# Patient Record
Sex: Female | Born: 2000 | Race: Black or African American | Hispanic: No | Marital: Single | Smoking: Never smoker
Health system: Southern US, Community
[De-identification: ages and names within clinical notes are randomized; demographics above are authoritative.]

---
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
## 2020-04-23 DIAGNOSIS — Z419 Encounter for procedure for purposes other than remedying health state, unspecified: Secondary | ICD-10-CM | POA: Diagnosis not present

## 2020-05-24 DIAGNOSIS — Z419 Encounter for procedure for purposes other than remedying health state, unspecified: Secondary | ICD-10-CM | POA: Diagnosis not present

## 2020-06-24 DIAGNOSIS — Z419 Encounter for procedure for purposes other than remedying health state, unspecified: Secondary | ICD-10-CM | POA: Diagnosis not present

## 2020-07-24 DIAGNOSIS — Z419 Encounter for procedure for purposes other than remedying health state, unspecified: Secondary | ICD-10-CM | POA: Diagnosis not present

## 2020-08-24 DIAGNOSIS — Z419 Encounter for procedure for purposes other than remedying health state, unspecified: Secondary | ICD-10-CM | POA: Diagnosis not present

## 2020-09-23 DIAGNOSIS — Z419 Encounter for procedure for purposes other than remedying health state, unspecified: Secondary | ICD-10-CM | POA: Diagnosis not present

## 2020-10-24 DIAGNOSIS — Z419 Encounter for procedure for purposes other than remedying health state, unspecified: Secondary | ICD-10-CM | POA: Diagnosis not present

## 2020-11-01 DIAGNOSIS — Z20822 Contact with and (suspected) exposure to covid-19: Secondary | ICD-10-CM | POA: Diagnosis not present

## 2020-11-24 DIAGNOSIS — Z419 Encounter for procedure for purposes other than remedying health state, unspecified: Secondary | ICD-10-CM | POA: Diagnosis not present

## 2020-12-18 ENCOUNTER — Encounter (HOSPITAL_COMMUNITY): Payer: Self-pay | Admitting: Emergency Medicine

## 2020-12-18 ENCOUNTER — Other Ambulatory Visit: Payer: Self-pay

## 2020-12-18 ENCOUNTER — Ambulatory Visit (HOSPITAL_COMMUNITY)
Admission: EM | Admit: 2020-12-18 | Discharge: 2020-12-18 | Disposition: A | Discharge status: Home / Self Care | Payer: Medicaid Other | Attending: Emergency Medicine | Admitting: Emergency Medicine

## 2020-12-18 DIAGNOSIS — K029 Dental caries, unspecified: Secondary | ICD-10-CM | POA: Diagnosis not present

## 2020-12-18 MED ORDER — IBUPROFEN 800 MG PO TABS: 800.0000 mg | ORAL_TABLET | Freq: Three times a day (TID) | ORAL | 0 refills | Status: AC | PRN

## 2020-12-18 MED ORDER — AMOXICILLIN 875 MG PO TABS: 875.0000 mg | ORAL_TABLET | Freq: Two times a day (BID) | ORAL | 0 refills | Status: AC

## 2020-12-18 NOTE — ED Triage Notes (Signed)
Pt presents with tooth pain xs 2 days. States is getting progressively worse. States has taken advil and mouth wash and gel with no relief.

## 2020-12-18 NOTE — ED Provider Notes (Signed)
[facility]    CSN: [id number] Arrival date & time: 12/18/20  1024      History   Chief Complaint Chief Complaint  Patient presents with  . Dental Pain    HPI Taylor Curtis is a 20 y.o. female.   Patient presents with 2-day history of dental pain.  She states the pain is on the left side, upper and lower, no specific tooth.  Treatment attempted at home with hydrogen peroxide mouthwash and Advil.  She denies fever, chills, difficulty swallowing, or other symptoms.  No pertinent medical history.  The history is provided by the patient.    History reviewed. No pertinent past medical history.  There are no problems to display for this patient.   History reviewed. No pertinent surgical history.  OB History   No obstetric history on file.      Home Medications    Prior to Admission medications   Medication Sig Start Date End Date Taking? Authorizing Provider  amoxicillin (AMOXIL) 875 MG tablet Take 1 tablet (875 mg total) by mouth 2 (two) times daily for 7 days. 12/18/20 12/25/20 Yes Mickie Bail, NP  ibuprofen (ADVIL) 800 MG tablet Take 1 tablet (800 mg total) by mouth every 8 (eight) hours as needed. 12/18/20  Yes Mickie Bail, NP    Family History History reviewed. No pertinent family history.  Social History Social History   Tobacco Use  . Smoking status: Never Smoker  . Smokeless tobacco: Never Used  Substance Use Topics  . Alcohol use: Not Currently     Allergies   Patient has no known allergies.   Review of Systems Review of Systems  Constitutional: Negative for chills and fever.  HENT: Positive for dental problem. Negative for ear pain and sore throat.   Eyes: Negative for pain and visual disturbance.  Respiratory: Negative for cough and shortness of breath.   Cardiovascular: Negative for chest pain and palpitations.  Gastrointestinal: Negative for abdominal pain and vomiting.  Genitourinary: Negative for dysuria and hematuria.   Musculoskeletal: Negative for arthralgias and back pain.  Skin: Negative for color change and rash.  Neurological: Negative for seizures and syncope.  All other systems reviewed and are negative.    Physical Exam Triage Vital Signs ED Triage Vitals  Enc Vitals Group     BP      Pulse      Resp      Temp      Temp src      SpO2      Weight      Height      Head Circumference      Peak Flow      Pain Score      Pain Loc      Pain Edu?      Excl. in GC?    No data found.  Updated Vital Signs BP 121/80 (BP Location: Right Arm)   Pulse 95   Temp 98.2 F (36.8 C) (Oral)   Resp 17   LMP 12/10/2020   SpO2 98%   Visual Acuity Right Eye Distance:   Left Eye Distance:   Bilateral Distance:    Right Eye Near:   Left Eye Near:    Bilateral Near:     Physical Exam Vitals and nursing note reviewed.  Constitutional:      General: She is not in acute distress.    Appearance: She is well-developed and well-nourished. She is not ill-appearing.  HENT:  Head: Normocephalic and atraumatic.     Mouth/Throat:     Mouth: Mucous membranes are moist.     Dentition: Dental tenderness and dental caries present.  Eyes:     Conjunctiva/sclera: Conjunctivae normal.  Cardiovascular:     Rate and Rhythm: Normal rate and regular rhythm.     Heart sounds: Normal heart sounds.  Pulmonary:     Effort: Pulmonary effort is normal. No respiratory distress.     Breath sounds: Normal breath sounds.  Abdominal:     Palpations: Abdomen is soft.     Tenderness: There is no abdominal tenderness.  Musculoskeletal:        General: No edema.     Cervical back: Neck supple.  Skin:    General: Skin is warm and dry.  Neurological:     General: No focal deficit present.     Mental Status: She is alert and oriented to person, place, and time.     Gait: Gait normal.  Psychiatric:        Mood and Affect: Mood and affect and mood normal.        Behavior: Behavior normal.      UC  Treatments / Results  Labs (all labs ordered are listed, but only abnormal results are displayed) Labs Reviewed - No data to display  EKG   Radiology No results found.  Procedures Procedures (including critical care time)  Medications Ordered in UC Medications - No data to display  Initial Impression / Assessment and Plan / UC Course  I have reviewed the triage vital signs and the nursing notes.  Pertinent labs & imaging results that were available during my care of the patient were reviewed by me and considered in my medical decision making (see chart for details).   Dental pain due to dental caries.  Treating with amoxicillin and ibuprofen.  Dental resource guide provided and patient instructed to schedule appointment with a dentist as soon as possible.  She agrees to plan of care.   Final Clinical Impressions(s) / UC Diagnoses   Final diagnoses:  Pain due to dental caries     Discharge Instructions     Take the antibiotic and ibuprofen as prescribed.    A dental resource guide is attached.  Please call to make an appointment with a dentist as soon as possible.          ED Prescriptions    Medication Sig Dispense Auth. Provider   amoxicillin (AMOXIL) 875 MG tablet Take 1 tablet (875 mg total) by mouth 2 (two) times daily for 7 days. 14 tablet Wendee Beavers H, NP   ibuprofen (ADVIL) 800 MG tablet Take 1 tablet (800 mg total) by mouth every 8 (eight) hours as needed. 21 tablet Mickie Bail, NP     I have reviewed the PDMP during this encounter.   Mickie Bail, NP 12/18/20 1158

## 2020-12-18 NOTE — Discharge Instructions (Signed)
Take the antibiotic and ibuprofen as prescribed.    A dental resource guide is attached.  Please call to make an appointment with a dentist as soon as possible.

## 2020-12-22 DIAGNOSIS — Z419 Encounter for procedure for purposes other than remedying health state, unspecified: Secondary | ICD-10-CM | POA: Diagnosis not present

## 2021-01-22 DIAGNOSIS — Z419 Encounter for procedure for purposes other than remedying health state, unspecified: Secondary | ICD-10-CM | POA: Diagnosis not present

## 2021-02-21 DIAGNOSIS — Z419 Encounter for procedure for purposes other than remedying health state, unspecified: Secondary | ICD-10-CM | POA: Diagnosis not present

## 2021-03-24 DIAGNOSIS — Z419 Encounter for procedure for purposes other than remedying health state, unspecified: Secondary | ICD-10-CM | POA: Diagnosis not present

## 2021-04-17 ENCOUNTER — Emergency Department (HOSPITAL_COMMUNITY)
Admission: EM | Admit: 2021-04-17 | Discharge: 2021-04-17 | Disposition: A | Discharge status: Home / Self Care | Payer: Medicaid Other | Attending: Emergency Medicine | Admitting: Emergency Medicine

## 2021-04-17 ENCOUNTER — Emergency Department (HOSPITAL_COMMUNITY): Payer: Medicaid Other

## 2021-04-17 DIAGNOSIS — M7918 Myalgia, other site: Secondary | ICD-10-CM

## 2021-04-17 DIAGNOSIS — S4991XA Unspecified injury of right shoulder and upper arm, initial encounter: Secondary | ICD-10-CM | POA: Diagnosis present

## 2021-04-17 DIAGNOSIS — S40021A Contusion of right upper arm, initial encounter: Secondary | ICD-10-CM | POA: Insufficient documentation

## 2021-04-17 DIAGNOSIS — M79601 Pain in right arm: Secondary | ICD-10-CM

## 2021-04-17 DIAGNOSIS — M542 Cervicalgia: Secondary | ICD-10-CM | POA: Diagnosis not present

## 2021-04-17 DIAGNOSIS — M79603 Pain in arm, unspecified: Secondary | ICD-10-CM | POA: Diagnosis not present

## 2021-04-17 DIAGNOSIS — T148XXA Other injury of unspecified body region, initial encounter: Secondary | ICD-10-CM

## 2021-04-17 DIAGNOSIS — M791 Myalgia, unspecified site: Secondary | ICD-10-CM | POA: Diagnosis not present

## 2021-04-17 DIAGNOSIS — Y92410 Unspecified street and highway as the place of occurrence of the external cause: Secondary | ICD-10-CM | POA: Insufficient documentation

## 2021-04-17 DIAGNOSIS — S199XXA Unspecified injury of neck, initial encounter: Secondary | ICD-10-CM | POA: Diagnosis not present

## 2021-04-17 DIAGNOSIS — Z041 Encounter for examination and observation following transport accident: Secondary | ICD-10-CM | POA: Diagnosis not present

## 2021-04-17 DIAGNOSIS — R519 Headache, unspecified: Secondary | ICD-10-CM | POA: Insufficient documentation

## 2021-04-17 DIAGNOSIS — S0990XA Unspecified injury of head, initial encounter: Secondary | ICD-10-CM | POA: Diagnosis not present

## 2021-04-17 DIAGNOSIS — M7989 Other specified soft tissue disorders: Secondary | ICD-10-CM | POA: Diagnosis not present

## 2021-04-17 LAB — COMPREHENSIVE METABOLIC PANEL
ALT: 10 U/L (ref 0–44)
AST: 18 U/L (ref 15–41)
Albumin: 4.2 g/dL (ref 3.5–5.0)
Alkaline Phosphatase: 65 U/L (ref 38–126)
Anion gap: 7 (ref 5–15)
BUN: 10 mg/dL (ref 6–20)
CO2: 25 mmol/L (ref 22–32)
Calcium: 10 mg/dL (ref 8.9–10.3)
Chloride: 108 mmol/L (ref 98–111)
Creatinine, Ser: 1.12 mg/dL — ABNORMAL HIGH (ref 0.44–1.00)
GFR, Estimated: >60 mL/min (ref >60)
Glucose, Bld: 92 mg/dL (ref 70–99)
Potassium: 3.8 mmol/L (ref 3.5–5.1)
Sodium: 140 mmol/L (ref 135–145)
Total Bilirubin: 1 mg/dL (ref 0.3–1.2)
Total Protein: 7.7 g/dL (ref 6.5–8.1)

## 2021-04-17 LAB — CBC WITH DIFFERENTIAL/PLATELET
Abs Immature Granulocytes: 0.04 10*3/uL (ref 0.00–0.07)
Basophils Absolute: 0 10*3/uL (ref 0.0–0.1)
Basophils Relative: 0 %
Eosinophils Absolute: 0.4 10*3/uL (ref 0.0–0.5)
Eosinophils Relative: 3 %
HCT: 36.2 % (ref 36.0–46.0)
Hemoglobin: 11.3 g/dL — ABNORMAL LOW (ref 12.0–15.0)
Immature Granulocytes: 0 %
Lymphocytes Relative: 33 %
Lymphs Abs: 4.5 10*3/uL — ABNORMAL HIGH (ref 0.7–4.0)
MCH: 27.8 pg (ref 26.0–34.0)
MCHC: 31.2 g/dL (ref 30.0–36.0)
MCV: 89.2 fL (ref 80.0–100.0)
Monocytes Absolute: 0.7 10*3/uL (ref 0.1–1.0)
Monocytes Relative: 5 %
Neutro Abs: 7.9 10*3/uL — ABNORMAL HIGH (ref 1.7–7.7)
Neutrophils Relative %: 59 %
Platelets: 335 10*3/uL (ref 150–400)
RBC: 4.06 MIL/uL (ref 3.87–5.11)
RDW: 14.5 % (ref 11.5–15.5)
WBC: 13.6 10*3/uL — ABNORMAL HIGH (ref 4.0–10.5)
nRBC: 0 % (ref 0.0–0.2)

## 2021-04-17 LAB — I-STAT BETA HCG BLOOD, ED (MC, WL, AP ONLY): I-stat hCG, quantitative: <5 m[IU]/mL (ref <5)

## 2021-04-17 MED ORDER — ONDANSETRON HCL 4 MG/2ML IJ SOLN
4.0000 mg | Freq: Once | INTRAMUSCULAR | Status: AC
  Administered 2021-04-17: 4 mg via INTRAVENOUS
  Filled 2021-04-17: qty 2

## 2021-04-17 MED ORDER — METHOCARBAMOL 500 MG PO TABS: 500.0000 mg | ORAL_TABLET | Freq: Two times a day (BID) | ORAL | 0 refills | Status: AC

## 2021-04-17 MED ORDER — FENTANYL CITRATE (PF) 100 MCG/2ML IJ SOLN
50.0000 ug | Freq: Once | INTRAMUSCULAR | Status: AC
  Administered 2021-04-17: 50 ug via INTRAVENOUS
  Filled 2021-04-17: qty 2
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
## 2021-04-23 DIAGNOSIS — Z419 Encounter for procedure for purposes other than remedying health state, unspecified: Secondary | ICD-10-CM | POA: Diagnosis not present

## 2021-05-24 DIAGNOSIS — Z419 Encounter for procedure for purposes other than remedying health state, unspecified: Secondary | ICD-10-CM | POA: Diagnosis not present

## 2021-06-24 DIAGNOSIS — Z419 Encounter for procedure for purposes other than remedying health state, unspecified: Secondary | ICD-10-CM | POA: Diagnosis not present

## 2021-07-24 DIAGNOSIS — Z419 Encounter for procedure for purposes other than remedying health state, unspecified: Secondary | ICD-10-CM | POA: Diagnosis not present

## 2021-08-24 DIAGNOSIS — Z419 Encounter for procedure for purposes other than remedying health state, unspecified: Secondary | ICD-10-CM | POA: Diagnosis not present

## 2021-09-23 DIAGNOSIS — Z419 Encounter for procedure for purposes other than remedying health state, unspecified: Secondary | ICD-10-CM | POA: Diagnosis not present

## 2021-10-24 DIAGNOSIS — Z419 Encounter for procedure for purposes other than remedying health state, unspecified: Secondary | ICD-10-CM | POA: Diagnosis not present

## 2021-11-24 DIAGNOSIS — Z419 Encounter for procedure for purposes other than remedying health state, unspecified: Secondary | ICD-10-CM | POA: Diagnosis not present

## 2021-12-22 DIAGNOSIS — Z419 Encounter for procedure for purposes other than remedying health state, unspecified: Secondary | ICD-10-CM | POA: Diagnosis not present

## 2022-01-22 DIAGNOSIS — Z419 Encounter for procedure for purposes other than remedying health state, unspecified: Secondary | ICD-10-CM | POA: Diagnosis not present

## 2022-02-21 DIAGNOSIS — Z419 Encounter for procedure for purposes other than remedying health state, unspecified: Secondary | ICD-10-CM | POA: Diagnosis not present

## 2022-03-24 DIAGNOSIS — Z419 Encounter for procedure for purposes other than remedying health state, unspecified: Secondary | ICD-10-CM | POA: Diagnosis not present

## 2022-04-23 DIAGNOSIS — Z419 Encounter for procedure for purposes other than remedying health state, unspecified: Secondary | ICD-10-CM | POA: Diagnosis not present

## 2022-05-24 DIAGNOSIS — Z419 Encounter for procedure for purposes other than remedying health state, unspecified: Secondary | ICD-10-CM | POA: Diagnosis not present

## 2022-06-24 DIAGNOSIS — Z419 Encounter for procedure for purposes other than remedying health state, unspecified: Secondary | ICD-10-CM | POA: Diagnosis not present

## 2022-07-24 DIAGNOSIS — Z419 Encounter for procedure for purposes other than remedying health state, unspecified: Secondary | ICD-10-CM | POA: Diagnosis not present

## 2022-09-23 DIAGNOSIS — Z419 Encounter for procedure for purposes other than remedying health state, unspecified: Secondary | ICD-10-CM | POA: Diagnosis not present

## 2022-10-24 DIAGNOSIS — Z419 Encounter for procedure for purposes other than remedying health state, unspecified: Secondary | ICD-10-CM | POA: Diagnosis not present
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
## 2022-11-24 DIAGNOSIS — Z419 Encounter for procedure for purposes other than remedying health state, unspecified: Secondary | ICD-10-CM | POA: Diagnosis not present

## 2022-12-23 DIAGNOSIS — Z419 Encounter for procedure for purposes other than remedying health state, unspecified: Secondary | ICD-10-CM | POA: Diagnosis not present

## 2023-01-23 DIAGNOSIS — Z419 Encounter for procedure for purposes other than remedying health state, unspecified: Secondary | ICD-10-CM | POA: Diagnosis not present

## 2023-02-22 DIAGNOSIS — Z419 Encounter for procedure for purposes other than remedying health state, unspecified: Secondary | ICD-10-CM | POA: Diagnosis not present

## 2023-03-02 ENCOUNTER — Telehealth: Payer: Self-pay

## 2023-03-02 NOTE — Telephone Encounter (Signed)
LVM for patient to call back. AS, CMA 

## 2023-03-25 DIAGNOSIS — Z419 Encounter for procedure for purposes other than remedying health state, unspecified: Secondary | ICD-10-CM | POA: Diagnosis not present

## 2023-04-24 DIAGNOSIS — Z419 Encounter for procedure for purposes other than remedying health state, unspecified: Secondary | ICD-10-CM | POA: Diagnosis not present

## 2023-05-25 DIAGNOSIS — Z419 Encounter for procedure for purposes other than remedying health state, unspecified: Secondary | ICD-10-CM | POA: Diagnosis not present

## 2023-06-25 DIAGNOSIS — Z419 Encounter for procedure for purposes other than remedying health state, unspecified: Secondary | ICD-10-CM | POA: Diagnosis not present

## 2023-07-25 DIAGNOSIS — Z419 Encounter for procedure for purposes other than remedying health state, unspecified: Secondary | ICD-10-CM | POA: Diagnosis not present

## 2023-08-25 DIAGNOSIS — Z419 Encounter for procedure for purposes other than remedying health state, unspecified: Secondary | ICD-10-CM | POA: Diagnosis not present

## 2023-09-24 DIAGNOSIS — Z419 Encounter for procedure for purposes other than remedying health state, unspecified: Secondary | ICD-10-CM | POA: Diagnosis not present

## 2023-10-25 DIAGNOSIS — Z419 Encounter for procedure for purposes other than remedying health state, unspecified: Secondary | ICD-10-CM | POA: Diagnosis not present

## 2023-11-24 ENCOUNTER — Emergency Department (HOSPITAL_COMMUNITY): Payer: No Typology Code available for payment source

## 2023-11-24 ENCOUNTER — Emergency Department (HOSPITAL_COMMUNITY)
Admission: EM | Admit: 2023-11-24 | Discharge: 2023-11-24 | Disposition: A | Discharge status: Home / Self Care | Payer: No Typology Code available for payment source | Attending: Emergency Medicine | Admitting: Emergency Medicine

## 2023-11-24 ENCOUNTER — Encounter (HOSPITAL_COMMUNITY): Payer: Self-pay

## 2023-11-24 ENCOUNTER — Other Ambulatory Visit: Payer: Self-pay

## 2023-11-24 DIAGNOSIS — M542 Cervicalgia: Secondary | ICD-10-CM | POA: Diagnosis not present

## 2023-11-24 DIAGNOSIS — S199XXA Unspecified injury of neck, initial encounter: Secondary | ICD-10-CM | POA: Diagnosis not present

## 2023-11-24 DIAGNOSIS — S5011XA Contusion of right forearm, initial encounter: Secondary | ICD-10-CM | POA: Insufficient documentation

## 2023-11-24 DIAGNOSIS — S59911A Unspecified injury of right forearm, initial encounter: Secondary | ICD-10-CM | POA: Diagnosis not present

## 2023-11-24 DIAGNOSIS — Z041 Encounter for examination and observation following transport accident: Secondary | ICD-10-CM | POA: Diagnosis not present

## 2023-11-24 DIAGNOSIS — S161XXA Strain of muscle, fascia and tendon at neck level, initial encounter: Secondary | ICD-10-CM | POA: Insufficient documentation

## 2023-11-24 DIAGNOSIS — M79639 Pain in unspecified forearm: Secondary | ICD-10-CM | POA: Diagnosis not present

## 2023-11-24 DIAGNOSIS — Y92410 Unspecified street and highway as the place of occurrence of the external cause: Secondary | ICD-10-CM | POA: Diagnosis not present

## 2023-11-24 DIAGNOSIS — R6889 Other general symptoms and signs: Secondary | ICD-10-CM | POA: Diagnosis not present

## 2023-11-24 DIAGNOSIS — M79631 Pain in right forearm: Secondary | ICD-10-CM | POA: Diagnosis not present

## 2023-11-24 DIAGNOSIS — Z743 Need for continuous supervision: Secondary | ICD-10-CM | POA: Diagnosis not present

## 2023-11-24 MED ORDER — ACETAMINOPHEN 325 MG PO TABS
650.0000 mg | ORAL_TABLET | Freq: Once | ORAL | Status: AC
  Administered 2023-11-24: 650 mg via ORAL
  Filled 2023-11-24: qty 2
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
## 2023-11-25 DIAGNOSIS — Z419 Encounter for procedure for purposes other than remedying health state, unspecified: Secondary | ICD-10-CM | POA: Diagnosis not present

## 2023-12-23 DIAGNOSIS — Z419 Encounter for procedure for purposes other than remedying health state, unspecified: Secondary | ICD-10-CM | POA: Diagnosis not present

## 2024-02-03 DIAGNOSIS — Z419 Encounter for procedure for purposes other than remedying health state, unspecified: Secondary | ICD-10-CM | POA: Diagnosis not present

## 2024-03-04 DIAGNOSIS — Z419 Encounter for procedure for purposes other than remedying health state, unspecified: Secondary | ICD-10-CM | POA: Diagnosis not present

## 2024-04-04 DIAGNOSIS — Z419 Encounter for procedure for purposes other than remedying health state, unspecified: Secondary | ICD-10-CM | POA: Diagnosis not present

## 2024-05-04 DIAGNOSIS — Z419 Encounter for procedure for purposes other than remedying health state, unspecified: Secondary | ICD-10-CM | POA: Diagnosis not present

## 2024-06-04 DIAGNOSIS — Z419 Encounter for procedure for purposes other than remedying health state, unspecified: Secondary | ICD-10-CM | POA: Diagnosis not present

## 2024-07-05 ENCOUNTER — Encounter (HOSPITAL_COMMUNITY): Payer: Self-pay

## 2024-07-05 ENCOUNTER — Ambulatory Visit (HOSPITAL_COMMUNITY)
Admission: EM | Admit: 2024-07-05 | Discharge: 2024-07-05 | Disposition: A | Discharge status: Home / Self Care | Attending: Emergency Medicine | Admitting: Emergency Medicine

## 2024-07-05 DIAGNOSIS — Z419 Encounter for procedure for purposes other than remedying health state, unspecified: Secondary | ICD-10-CM | POA: Diagnosis not present

## 2024-07-05 DIAGNOSIS — J029 Acute pharyngitis, unspecified: Secondary | ICD-10-CM

## 2024-07-05 LAB — POCT RAPID STREP A (OFFICE): Rapid Strep A Screen: NEGATIVE

## 2024-07-05 MED ORDER — AZITHROMYCIN 250 MG PO TABS: 250.0000 mg | ORAL_TABLET | Freq: Every day | ORAL | 0 refills | Status: AC

## 2024-07-05 NOTE — ED Triage Notes (Signed)
 Pt states sore throat for the past 4 days. States she has been taking cough drops at home and drinking hot tea.

## 2024-07-05 NOTE — ED Provider Notes (Signed)
 [facility]    CSN: [id number] Arrival date & time: 07/05/24  1344      History   Chief Complaint Chief Complaint  Patient presents with   Sore Throat    HPI Taylor Curtis is a 23 y.o. female.   Patient presents today with sore throat for the past couple of days.  Denies any fevers no shortness of breath or chest pain.  She has been taking cough drops and drinking tea at home with no relief.  Has had strep in the past with similar symptoms    History reviewed. No pertinent past medical history.  There are no active problems to display for this patient.   History reviewed. No pertinent surgical history.  OB History   No obstetric history on file.      Home Medications    Prior to Admission medications   Medication Sig Start Date End Date Taking? Authorizing Provider  azithromycin  (ZITHROMAX ) 250 MG tablet Take 1 tablet (250 mg total) by mouth daily. Take first 2 tablets together, then 1 every day until finished. 07/05/24  Yes Merilee Andrea CROME, NP  ibuprofen  (ADVIL ) 800 MG tablet Take 1 tablet (800 mg total) by mouth every 8 (eight) hours as needed. 12/18/20   Corlis Burnard DEL, NP  methocarbamol  (ROBAXIN ) 500 MG tablet Take 1 tablet (500 mg total) by mouth 2 (two) times daily. 04/17/21   Layden, Lindsey A, PA-C  Multiple Vitamins-Minerals (MULTIVITAMIN ADULT) CHEW Chew 2 tablets by mouth daily.    [provider]    Family History History reviewed. No pertinent family history.  Social History Social History   Tobacco Use   Smoking status: Never   Smokeless tobacco: Never  Substance Use Topics   Alcohol use: Not Currently     Allergies   Patient has no known allergies.   Review of Systems Review of Systems  Constitutional:  Negative for chills.       Unknown fever has not been checking  HENT:  Positive for sore throat. Negative for congestion.   Respiratory: Negative.    Cardiovascular: Negative.   Gastrointestinal: Negative.    Genitourinary: Negative.      Physical Exam Triage Vital Signs ED Triage Vitals [07/05/24 1509]  Encounter Vitals Group     BP 109/74     Girls Systolic BP Percentile      Girls Diastolic BP Percentile      Boys Systolic BP Percentile      Boys Diastolic BP Percentile      Pulse Rate 96     Resp 16     Temp 98.2 F (36.8 C)     Temp Source Oral     SpO2 97 %     Weight      Height      Head Circumference      Peak Flow      Pain Score 6     Pain Loc      Pain Education      Exclude from Growth Chart    No data found.  Updated Vital Signs BP 109/74 (BP Location: Right Arm)   Pulse 96   Temp 98.2 F (36.8 C) (Oral)   Resp 16   LMP 05/31/2024 (Approximate)   SpO2 97%   Visual Acuity Right Eye Distance:   Left Eye Distance:   Bilateral Distance:    Right Eye Near:   Left Eye Near:    Bilateral Near:     Physical  Exam Constitutional:      Appearance: She is well-developed.  HENT:     Right Ear: Tympanic membrane normal.     Left Ear: Tympanic membrane normal.     Nose: No congestion or rhinorrhea.     Mouth/Throat:     Mouth: Oral lesions present.     Pharynx: Pharyngeal swelling present.     Tonsils: Tonsillar exudate present. No tonsillar abscesses. 2+ on the right. 2+ on the left.  Cardiovascular:     Rate and Rhythm: Normal rate.     Heart sounds: Normal heart sounds.  Pulmonary:     Effort: Pulmonary effort is normal.  Abdominal:     Palpations: Abdomen is soft.  Skin:    Findings: No rash.  Neurological:     Mental Status: She is alert.      UC Treatments / Results  Labs (all labs ordered are listed, but only abnormal results are displayed) Labs Reviewed  POCT RAPID STREP A (OFFICE) - Normal    EKG   Radiology No results found.  Procedures Procedures (including critical care time)  Medications Ordered in UC Medications - No data to display  Initial Impression / Assessment and Plan / UC Course  I have reviewed the triage  vital signs and the nursing notes.  Pertinent labs & imaging results that were available during my care of the patient were reviewed by me and considered in my medical decision making (see chart for details).     Patient has a moderate amount of exudate and oral lesions.  We will treat symptomatically Tylenol  or Motrin  as needed for pain or fever Take full dose of antibiotics Dehydrated drink plenty of fluids And use Chloraseptic spray to help with the discomfort Final Clinical Impressions(s) / UC Diagnoses   Final diagnoses:  Viral pharyngitis     Discharge Instructions      Tylenol  or Motrin  as needed for pain or fever Take full dose of antibiotics Dehydrated drink plenty of fluids And use Chloraseptic spray to help with the discomfort        ED Prescriptions     Medication Sig Dispense Auth. Provider   azithromycin  (ZITHROMAX ) 250 MG tablet Take 1 tablet (250 mg total) by mouth daily. Take first 2 tablets together, then 1 every day until finished. 6 tablet Merilee Andrea CROME, NP      PDMP not reviewed this encounter.   Merilee Andrea CROME, NP 07/05/24 1549

## 2024-07-05 NOTE — Discharge Instructions (Addendum)
 Tylenol  or Motrin  as needed for pain or fever Take full dose of antibiotics Dehydrated drink plenty of fluids And use Chloraseptic spray to help with the discomfort

## 2024-08-04 DIAGNOSIS — Z419 Encounter for procedure for purposes other than remedying health state, unspecified: Secondary | ICD-10-CM | POA: Diagnosis not present

## 2024-10-04 DIAGNOSIS — Z419 Encounter for procedure for purposes other than remedying health state, unspecified: Secondary | ICD-10-CM | POA: Diagnosis not present
